# Patient Record
Sex: Female | Born: 1975 | Race: Black or African American | Hispanic: No | Marital: Single | Smoking: Current every day smoker
Health system: Southern US, Community
[De-identification: ages and names within clinical notes are randomized; demographics above are authoritative.]

---

## 2005-06-06 ENCOUNTER — Observation Stay: Payer: Self-pay | Admitting: Obstetrics and Gynecology

## 2005-08-29 ENCOUNTER — Observation Stay: Payer: Self-pay | Admitting: Obstetrics and Gynecology

## 2005-09-04 ENCOUNTER — Inpatient Hospital Stay: Payer: Self-pay | Admitting: Obstetrics and Gynecology

## 2007-01-05 ENCOUNTER — Emergency Department (HOSPITAL_COMMUNITY): Admission: EM | Admit: 2007-01-05 | Discharge: 2007-01-05 | Payer: Self-pay | Admitting: Emergency Medicine

## 2008-06-24 ENCOUNTER — Emergency Department: Payer: Self-pay | Admitting: Emergency Medicine

## 2008-08-17 ENCOUNTER — Emergency Department: Payer: Self-pay | Admitting: Emergency Medicine

## 2009-12-12 ENCOUNTER — Emergency Department: Payer: Self-pay | Admitting: Emergency Medicine

## 2013-02-11 ENCOUNTER — Emergency Department: Payer: Self-pay | Admitting: Emergency Medicine

## 2014-05-06 ENCOUNTER — Emergency Department: Payer: Self-pay | Admitting: Emergency Medicine

## 2014-05-21 ENCOUNTER — Emergency Department: Payer: Self-pay | Admitting: Emergency Medicine

## 2015-03-04 ENCOUNTER — Encounter: Payer: Self-pay | Admitting: *Deleted

## 2015-03-04 ENCOUNTER — Emergency Department
Admission: EM | Admit: 2015-03-04 | Discharge: 2015-03-04 | Disposition: A | Discharge status: Home / Self Care | Payer: Self-pay | Attending: Emergency Medicine | Admitting: Emergency Medicine

## 2015-03-04 DIAGNOSIS — K047 Periapical abscess without sinus: Secondary | ICD-10-CM

## 2015-03-04 DIAGNOSIS — R220 Localized swelling, mass and lump, head: Secondary | ICD-10-CM

## 2015-03-04 DIAGNOSIS — R112 Nausea with vomiting, unspecified: Secondary | ICD-10-CM | POA: Insufficient documentation

## 2015-03-04 DIAGNOSIS — Z720 Tobacco use: Secondary | ICD-10-CM | POA: Insufficient documentation

## 2015-03-04 DIAGNOSIS — R51 Headache: Secondary | ICD-10-CM | POA: Insufficient documentation

## 2015-03-04 DIAGNOSIS — K029 Dental caries, unspecified: Secondary | ICD-10-CM

## 2015-03-04 DIAGNOSIS — R42 Dizziness and giddiness: Secondary | ICD-10-CM | POA: Insufficient documentation

## 2015-03-04 LAB — CBC WITH DIFFERENTIAL/PLATELET
BASOS ABS: 0.1 10*3/uL (ref 0–0.1)
Basophils Relative: 1 %
Eosinophils Absolute: 0.2 10*3/uL (ref 0–0.7)
Eosinophils Relative: 2 %
HEMATOCRIT: 42.9 % (ref 35.0–47.0)
Hemoglobin: 14.5 g/dL (ref 12.0–16.0)
LYMPHS ABS: 4 10*3/uL — AB (ref 1.0–3.6)
LYMPHS PCT: 29 %
MCH: 31.9 pg (ref 26.0–34.0)
MCHC: 33.8 g/dL (ref 32.0–36.0)
MCV: 94.5 fL (ref 80.0–100.0)
MONO ABS: 1.1 10*3/uL — AB (ref 0.2–0.9)
Monocytes Relative: 8 %
NEUTROS ABS: 8.2 10*3/uL — AB (ref 1.4–6.5)
Neutrophils Relative %: 60 %
Platelets: 214 10*3/uL (ref 150–440)
RBC: 4.54 MIL/uL (ref 3.80–5.20)
RDW: 14 % (ref 11.5–14.5)
WBC: 13.5 10*3/uL — ABNORMAL HIGH (ref 3.6–11.0)

## 2015-03-04 MED ORDER — TRAMADOL HCL 50 MG PO TABS: 50.0000 mg | ORAL_TABLET | Freq: Four times a day (QID) | ORAL | Status: DC | PRN

## 2015-03-04 MED ORDER — CLINDAMYCIN PHOSPHATE 600 MG/50ML IV SOLN
600.0000 mg | Freq: Once | INTRAVENOUS | Status: AC
  Administered 2015-03-04: 600 mg via INTRAVENOUS
  Filled 2015-03-04: qty 50

## 2015-03-04 MED ORDER — CLINDAMYCIN HCL 300 MG PO CAPS: 300.0000 mg | ORAL_CAPSULE | Freq: Three times a day (TID) | ORAL | Status: DC

## 2015-03-04 MED ORDER — TRAMADOL HCL 50 MG PO TABS
50.0000 mg | ORAL_TABLET | Freq: Once | ORAL | Status: AC
  Administered 2015-03-04: 50 mg via ORAL
  Filled 2015-03-04: qty 1

## 2015-03-04 NOTE — ED Provider Notes (Signed)
[facility] Emergency Department Provider Note  ____________________________________________  Time seen: Approximately 194 AM  I have reviewed the triage vital signs and the nursing notes.   HISTORY  Chief Complaint Dental Pain    HPI Chelsey Edwards is a 39 y.o. female who comes in with a dental abscess. The patient reports that she had a bad tooth on the side. The patient has been taking Motrin, Tylenol and Aleve but the pain has not been getting any better. The patient reports her pain as a 10 out of 10 in intensity. She reports that she felt hot but did not take her temperature. The patient has had a headache and some lightheadedness. She reports that this abscess and swelling of her facial up yesterday. The patient has some nausea with vomiting on Friday. She denies abdominal pain or shortness of breath. She reports that it does hurt to open her mouth but she is able to open her mouth. The patient denies any other problems at this time. She reports she still waiting for her dental insurance before she sees the dentist.   History reviewed. No pertinent past medical history.  There are no active problems to display for this patient.   History reviewed. No pertinent past surgical history.  Current Outpatient Rx  Name  Route  Sig  Dispense  Refill  . clindamycin (CLEOCIN) 300 MG capsule   Oral   Take 1 capsule (300 mg total) by mouth 3 (three) times daily.   15 capsule   0   . traMADol (ULTRAM) 50 MG tablet   Oral   Take 1 tablet (50 mg total) by mouth every 6 (six) hours as needed.   12 tablet   0     Allergies Review of patient's allergies indicates no known allergies.  History reviewed. No pertinent family history.  Social History Social History  Substance Use Topics  . Smoking status: Current Every Day Smoker -- 0.50 packs/day    Types: Cigarettes  . Smokeless tobacco: None  . Alcohol Use: Yes     Comment: occasionally    Review  of Systems Constitutional: No fever/chills Eyes: No visual changes. ENT: Dental pain No sore throat. Cardiovascular: Denies chest pain. Respiratory: Denies shortness of breath. Gastrointestinal: Nausea and vomiting Genitourinary: Negative for dysuria. Musculoskeletal: Negative for back pain. Skin: Negative for rash. Neurological: Dizziness  10-point ROS otherwise negative.  ____________________________________________   PHYSICAL EXAM:  VITAL SIGNS: ED Triage Vitals  Enc Vitals Group     BP 03/04/15 0312 133/86 mmHg     Pulse Rate 03/04/15 0312 96     Resp 03/04/15 0312 14     Temp 03/04/15 0312 98.6 F (37 C)     Temp Source 03/04/15 0312 Oral     SpO2 03/04/15 0312 96 %     Weight 03/04/15 0312 197 lb (89.359 kg)     Height 03/04/15 0312 5' 4.5" (1.638 m)     Head Cir --      Peak Flow --      Pain Score 03/04/15 0313 10     Pain Loc --      Pain Edu? --      Excl. in [place]? --     Constitutional: Alert and oriented. Well appearing and in moderate distress. Eyes: Conjunctivae are normal. PERRL. EOMI. Head: Atraumatic. Nose: No congestion/rhinnorhea. Mouth/Throat: Mucous membranes are moist.  Oropharynx non-erythematous. Poor dentition, left 1st molar with decay and tender to palpation, soft tissue swelling  to left jaw with no definitive abscess Cardiovascular: Normal rate, regular rhythm. Grossly normal heart sounds.  Good peripheral circulation. Respiratory: Normal respiratory effort.  No retractions. Lungs CTAB. Gastrointestinal: Soft and nontender. No distention. Positive bowel sounds Musculoskeletal: No lower extremity tenderness nor edema.   Neurologic:  Normal speech and language. Skin:  Skin is warm, dry and intact.  Psychiatric: Mood and affect are normal.   ____________________________________________   LABS (all labs ordered are listed, but only abnormal results are displayed)  Labs Reviewed  CBC WITH DIFFERENTIAL/PLATELET - Abnormal; Notable for the  following:    WBC 13.5 (*)    Neutro Abs 8.2 (*)    Lymphs Abs 4.0 (*)    Monocytes Absolute 1.1 (*)    All other components within normal limits   ____________________________________________  EKG  none ____________________________________________  RADIOLOGY  none ____________________________________________   PROCEDURES  Procedure(s) performed: None  Critical Care performed: No  ____________________________________________   INITIAL IMPRESSION / ASSESSMENT AND PLAN / ED COURSE  Pertinent labs & imaging results that were available during my care of the patient were reviewed by me and considered in my medical decision making (see chart for details).  This is a 39 year old female who comes in with left-sided facial pain and swelling consistent with a dental abscess. The patient did have a white blood cell count done which was 13. The patient is not vomiting but reports that she feels unwell. I will give the patient a dose of clindamycin IV as well as some tramadol orally for her pain. The patient is driving home so I cannot give her a stronger narcotic. I will discharge the patient and I did encourage her to follow-up with the [place] [facility] in the next 1-2 days for extraction of her tooth. ____________________________________________   FINAL CLINICAL IMPRESSION(S) / ED DIAGNOSES  Final diagnoses:  Dental abscess  Facial swelling  Dental caries      Loney Hering, MD 03/04/15 [phone]

## 2015-03-04 NOTE — Discharge Instructions (Signed)
Dental Abscess A dental abscess is a collection of infected fluid (pus) from a bacterial infection in the inner part of the tooth (pulp). It usually occurs at the end of the tooth's root.  CAUSES   Severe tooth decay.  Trauma to the tooth that allows bacteria to enter into the pulp, such as a broken or chipped tooth. SYMPTOMS   Severe pain in and around the infected tooth.  Swelling and redness around the abscessed tooth or in the mouth or face.  Tenderness.  Pus drainage.  Bad breath.  Bitter taste in the mouth.  Difficulty swallowing.  Difficulty opening the mouth.  Nausea.  Vomiting.  Chills.  Swollen neck glands. DIAGNOSIS   A medical and dental history will be taken.  An examination will be performed by tapping on the abscessed tooth.  X-rays may be taken of the tooth to identify the abscess. TREATMENT The goal of treatment is to eliminate the infection. You may be prescribed antibiotic medicine to stop the infection from spreading. A root canal may be performed to save the tooth. If the tooth cannot be saved, it may be pulled (extracted) and the abscess may be drained.  HOME CARE INSTRUCTIONS  Only take over-the-counter or prescription medicines for pain, fever, or discomfort as directed by your caregiver.  Rinse your mouth (gargle) often with salt water ( tsp salt in 8 oz [250 ml] of warm water) to relieve pain or swelling.  Do not drive after taking pain medicine (narcotics).  Do not apply heat to the outside of your face.  Return to your dentist for further treatment as directed. SEEK MEDICAL CARE IF:  Your pain is not helped by medicine.  Your pain is getting worse instead of better. SEEK IMMEDIATE MEDICAL CARE IF:  You have a fever or persistent symptoms for more than 2-3 days.  You have a fever and your symptoms suddenly get worse.  You have chills or a very bad headache.  You have problems breathing or swallowing.  You have trouble  opening your mouth.  You have swelling in the neck or around the eye. Document Released: 05/26/2005 Document Revised: 02/18/2012 Document Reviewed: 09/03/2010 [facility] Patient Information 2015 [place], [place]. This information is not intended to replace advice given to you by your health care provider. Make sure you discuss any questions you have with your health care provider.  Dental Pain A tooth ache may be caused by cavities (tooth decay). Cavities expose the nerve of the tooth to air and hot or cold temperatures. It may come from an infection or abscess (also called a boil or furuncle) around your tooth. It is also often caused by dental caries (tooth decay). This causes the pain you are having. DIAGNOSIS  Your caregiver can diagnose this problem by exam. TREATMENT   If caused by an infection, it may be treated with medications which kill germs (antibiotics) and pain medications as prescribed by your caregiver. Take medications as directed.  Only take over-the-counter or prescription medicines for pain, discomfort, or fever as directed by your caregiver.  Whether the tooth ache today is caused by infection or dental disease, you should see your dentist as soon as possible for further care. SEEK MEDICAL CARE IF: The exam and treatment you received today has been provided on an emergency basis only. This is not a substitute for complete medical or dental care. If your problem worsens or new problems (symptoms) appear, and you are unable to meet with your dentist, call or  return to this location. SEEK IMMEDIATE MEDICAL CARE IF:   You have a fever.  You develop redness and swelling of your face, jaw, or neck.  You are unable to open your mouth.  You have severe pain uncontrolled by pain medicine. MAKE SURE YOU:   Understand these instructions.  Will watch your condition.  Will get help right away if you are not doing well or get worse. Document Released: 05/26/2005 Document  Revised: 08/18/2011 Document Reviewed: 01/12/2008 [facility] Patient Information 2015 [place], [place]. This information is not intended to replace advice given to you by your health care provider. Make sure you discuss any questions you have with your health care provider.  Dental Caries Dental caries (also called tooth decay) is the most common oral disease. It can occur at any age but is more common in children and young adults.  HOW DENTAL CARIES DEVELOPS  The process of decay begins when bacteria and foods (particularly sugars and starches) combine in your mouth to produce plaque. Plaque is a substance that sticks to the hard, outer surface of a tooth (enamel). The bacteria in plaque produce acids that attack enamel. These acids may also attack the root surface of a tooth (cementum) if it is exposed. Repeated attacks dissolve these surfaces and create holes in the tooth (cavities). If left untreated, the acids destroy the other layers of the tooth.  RISK FACTORS  Frequent sipping of sugary beverages.   Frequent snacking on sugary and starchy foods, especially those that easily get stuck in the teeth.   Poor oral hygiene.   Dry mouth.   Substance abuse such as methamphetamine abuse.   Broken or poor-fitting dental restorations.   Eating disorders.   Gastroesophageal reflux disease (GERD).   Certain radiation treatments to the head and neck. SYMPTOMS In the early stages of dental caries, symptoms are seldom present. Sometimes white, chalky areas may be seen on the enamel or other tooth layers. In later stages, symptoms may include:  Pits and holes on the enamel.  Toothache after sweet, hot, or cold foods or drinks are consumed.  Pain around the tooth.  Swelling around the tooth. DIAGNOSIS  Most of the time, dental caries is detected during a regular dental checkup. A diagnosis is made after a thorough medical and dental history is taken and the surfaces of your teeth are  checked for signs of dental caries. Sometimes special instruments, such as lasers, are used to check for dental caries. Dental X-ray exams may be taken so that areas not visible to the eye (such as between the contact areas of the teeth) can be checked for cavities.  TREATMENT  If dental caries is in its early stages, it may be reversed with a fluoride treatment or an application of a remineralizing agent at the dental office. Thorough brushing and flossing at home is needed to aid these treatments. If it is in its later stages, treatment depends on the location and extent of tooth destruction:   If a small area of the tooth has been destroyed, the destroyed area will be removed and cavities will be filled with a material such as gold, silver amalgam, or composite resin.   If a large area of the tooth has been destroyed, the destroyed area will be removed and a cap (crown) will be fitted over the remaining tooth structure.   If the center part of the tooth (pulp) is affected, a procedure called a root canal will be needed before a filling or  crown can be placed.   If most of the tooth has been destroyed, the tooth may need to be pulled (extracted). HOME CARE INSTRUCTIONS You can prevent, stop, or reverse dental caries at home by practicing good oral hygiene. Good oral hygiene includes:  Thoroughly cleaning your teeth at least twice a day with a toothbrush and dental floss.   Using a fluoride toothpaste. A fluoride mouth rinse may also be used if recommended by your dentist or health care provider.   Restricting the amount of sugary and starchy foods and sugary liquids you consume.   Avoiding frequent snacking on these foods and sipping of these liquids.   Keeping regular visits with a dentist for checkups and cleanings. PREVENTION   Practice good oral hygiene.  Consider a dental sealant. A dental sealant is a coating material that is applied by your dentist to the pits and grooves  of teeth. The sealant prevents food from being trapped in them. It may protect the teeth for several years.  Ask about fluoride supplements if you live in a community without fluorinated water or with water that has a low fluoride content. Use fluoride supplements as directed by your dentist or health care provider.  Allow fluoride varnish applications to teeth if directed by your dentist or health care provider. Document Released: 02/15/2002 Document Revised: 10/10/2013 Document Reviewed: 05/28/2012 [facility] Patient Information 2015 [place], [place]. This information is not intended to replace advice given to you by your health care provider. Make sure you discuss any questions you have with your health care provider.

## 2015-03-04 NOTE — ED Notes (Deleted)
Pt presents in [employer] custody for forensic blood draw. Pt minor, not accompanied by parent. Pt was ambulatory and in no acute distress at this time.

## 2015-03-04 NOTE — ED Notes (Signed)
Pt reports pain to left lower jaw x 2 days, swelling starting yesterday, triage nurse states swelling has worsened while pt waiting.  Missing molar noted, pt reports extracted years ago.  Tooth next to gap appears may have possible cavity.  Pt NAD at this time.

## 2015-03-04 NOTE — ED Notes (Signed)
Pt states lower left dental pain began 03/02/15 around lunchtime. Swelling began 03/03/15 and has continued to worsen. Pt denies any precipitating events.

## 2019-12-13 ENCOUNTER — Emergency Department: Payer: Self-pay

## 2019-12-13 ENCOUNTER — Emergency Department
Admission: EM | Admit: 2019-12-13 | Discharge: 2019-12-13 | Disposition: A | Discharge status: Home / Self Care | Payer: Self-pay | Attending: Emergency Medicine | Admitting: Emergency Medicine

## 2019-12-13 ENCOUNTER — Other Ambulatory Visit: Payer: Self-pay

## 2019-12-13 ENCOUNTER — Encounter: Payer: Self-pay | Admitting: *Deleted

## 2019-12-13 DIAGNOSIS — W010XXA Fall on same level from slipping, tripping and stumbling without subsequent striking against object, initial encounter: Secondary | ICD-10-CM | POA: Insufficient documentation

## 2019-12-13 DIAGNOSIS — Y92091 Bathroom in other non-institutional residence as the place of occurrence of the external cause: Secondary | ICD-10-CM | POA: Insufficient documentation

## 2019-12-13 DIAGNOSIS — R52 Pain, unspecified: Secondary | ICD-10-CM

## 2019-12-13 DIAGNOSIS — S93402A Sprain of unspecified ligament of left ankle, initial encounter: Secondary | ICD-10-CM | POA: Insufficient documentation

## 2019-12-13 DIAGNOSIS — Y999 Unspecified external cause status: Secondary | ICD-10-CM | POA: Insufficient documentation

## 2019-12-13 DIAGNOSIS — Y93E1 Activity, personal bathing and showering: Secondary | ICD-10-CM | POA: Insufficient documentation

## 2019-12-13 DIAGNOSIS — M25572 Pain in left ankle and joints of left foot: Secondary | ICD-10-CM | POA: Insufficient documentation

## 2019-12-13 NOTE — ED Triage Notes (Signed)
Pt stating she was cleaning the shower, slipped in the water, now having left ankle pain and swelling.

## 2019-12-13 NOTE — ED Provider Notes (Signed)
  ER Provider Note       Time seen: 9:20 PM    I have reviewed the vital signs and the nursing notes.  HISTORY   Chief Complaint Ankle Pain    HPI Chelsey Edwards is a 44 y.o. female with no known past medical history who presents today for left ankle pain and swelling.  Patient states she was cleaning the shower and she slipped in the water.  Discomfort is 8 out of 10 in the left ankle.  History reviewed. No pertinent past medical history.  History reviewed. No pertinent surgical history.  Allergies Patient has no known allergies.  Review of Systems Constitutional: Negative for fever. Musculoskeletal: Positive for left ankle pain Skin: Negative for rash. Neurological: Negative for headaches, focal weakness or numbness.  All systems negative/normal/unremarkable except as stated in the HPI  ____________________________________________   PHYSICAL EXAM:  VITAL SIGNS: Vitals:   12/13/19 2004  BP: (!) 151/93  Pulse: 96  Resp: 16  Temp: 99.2 F (37.3 C)  SpO2: 97%    Constitutional: Alert and oriented. Well appearing and in no distress. Musculoskeletal: Mild tenderness and swelling over the left ankle, pain with inversion of the left ankle Neurologic:  Normal speech and language. No gross focal neurologic deficits are appreciated.  Skin:  Skin is warm, dry, there are 2 abrasions or superficial lacerations over the lateral malleolus and 1 over the lateral aspect of the fifth digit of uncertain etiology Psychiatric: Speech and behavior are normal.  ____________________________________________   RADIOLOGY  Images were viewed by me X-rays of the ankle and foot are unremarkable  DIFFERENTIAL DIAGNOSIS  Contusion, sprain, fracture, dislocation  ASSESSMENT AND PLAN  Left ankle sprain   Plan: The patient had presented for left ankle pain after slipping and falling.  She was offered a tetanus shot but states she will get this as an outpatient.  We will place  her in an Ace wrap and advised light activity and NSAIDs.  She is cleared for outpatient follow-up.  Lenise Arena MD    Note: This note was generated in part or whole with voice recognition software. Voice recognition is usually quite accurate but there are transcription errors that can and very often do occur. I apologize for any typographical errors that were not detected and corrected.     Earleen Newport, MD 12/13/19 2125

## 2020-07-16 ENCOUNTER — Emergency Department: Payer: 59

## 2020-07-16 ENCOUNTER — Other Ambulatory Visit: Payer: Self-pay

## 2020-07-16 ENCOUNTER — Emergency Department
Admission: EM | Admit: 2020-07-16 | Discharge: 2020-07-16 | Disposition: A | Discharge status: Home / Self Care | Payer: 59 | Attending: Student in an Organized Health Care Education/Training Program | Admitting: Student in an Organized Health Care Education/Training Program

## 2020-07-16 DIAGNOSIS — F17210 Nicotine dependence, cigarettes, uncomplicated: Secondary | ICD-10-CM | POA: Diagnosis not present

## 2020-07-16 DIAGNOSIS — R1031 Right lower quadrant pain: Secondary | ICD-10-CM | POA: Insufficient documentation

## 2020-07-16 LAB — CBC
HCT: 38.2 % (ref 36.0–46.0)
Hemoglobin: 12.6 g/dL (ref 12.0–15.0)
MCH: 30.5 pg (ref 26.0–34.0)
MCHC: 33 g/dL (ref 30.0–36.0)
MCV: 92.5 fL (ref 80.0–100.0)
Platelets: 295 10*3/uL (ref 150–400)
RBC: 4.13 MIL/uL (ref 3.87–5.11)
RDW: 14.9 % (ref 11.5–15.5)
WBC: 10.6 10*3/uL — ABNORMAL HIGH (ref 4.0–10.5)
nRBC: 0 % (ref 0.0–0.2)

## 2020-07-16 LAB — URINALYSIS, COMPLETE (UACMP) WITH MICROSCOPIC
Bacteria, UA: NONE SEEN
Bilirubin Urine: NEGATIVE
Glucose, UA: NEGATIVE mg/dL
Ketones, ur: NEGATIVE mg/dL
Leukocytes,Ua: NEGATIVE
Nitrite: NEGATIVE
Protein, ur: NEGATIVE mg/dL
Specific Gravity, Urine: 1.016 (ref 1.005–1.030)
pH: 5 (ref 5.0–8.0)

## 2020-07-16 LAB — PREGNANCY, URINE: Preg Test, Ur: NEGATIVE

## 2020-07-16 LAB — COMPREHENSIVE METABOLIC PANEL
ALT: 13 U/L (ref 0–44)
AST: 14 U/L — ABNORMAL LOW (ref 15–41)
Albumin: 4.1 g/dL (ref 3.5–5.0)
Alkaline Phosphatase: 49 U/L (ref 38–126)
Anion gap: 9 (ref 5–15)
BUN: 9 mg/dL (ref 6–20)
CO2: 23 mmol/L (ref 22–32)
Calcium: 9 mg/dL (ref 8.9–10.3)
Chloride: 105 mmol/L (ref 98–111)
Creatinine, Ser: 0.58 mg/dL (ref 0.44–1.00)
GFR, Estimated: >60 mL/min (ref >60)
Glucose, Bld: 87 mg/dL (ref 70–99)
Potassium: 4.1 mmol/L (ref 3.5–5.1)
Sodium: 137 mmol/L (ref 135–145)
Total Bilirubin: 0.3 mg/dL (ref 0.3–1.2)
Total Protein: 7.4 g/dL (ref 6.5–8.1)

## 2020-07-16 LAB — LIPASE, BLOOD: Lipase: 31 U/L (ref 11–51)

## 2020-07-16 MED ORDER — ONDANSETRON HCL 4 MG/2ML IJ SOLN
4.0000 mg | Freq: Once | INTRAMUSCULAR | Status: AC
  Administered 2020-07-16: 4 mg via INTRAVENOUS
  Filled 2020-07-16: qty 2

## 2020-07-16 MED ORDER — IOHEXOL 300 MG/ML  SOLN
100.0000 mL | Freq: Once | INTRAMUSCULAR | Status: AC | PRN
  Administered 2020-07-16: 100 mL via INTRAVENOUS
  Filled 2020-07-16: qty 100

## 2020-07-16 MED ORDER — KETOROLAC TROMETHAMINE 30 MG/ML IJ SOLN
15.0000 mg | Freq: Once | INTRAMUSCULAR | Status: AC
  Administered 2020-07-16: 15 mg via INTRAVENOUS
  Filled 2020-07-16: qty 1

## 2020-07-16 MED ORDER — MORPHINE SULFATE (PF) 4 MG/ML IV SOLN
4.0000 mg | INTRAVENOUS | Status: DC | PRN
  Administered 2020-07-16: 4 mg via INTRAVENOUS
  Filled 2020-07-16: qty 1

## 2020-07-16 NOTE — ED Provider Notes (Signed)
[facility] Emergency Department Provider Note    Event Date/Time   First MD Initiated Contact with Patient 07/16/20 1340     (approximate)  I have reviewed the triage vital signs and the nursing notes.   HISTORY  Chief Complaint Abdominal Pain    HPI Chelsey Edwards is a 45 y.o. female no significant past medical history presents to the ER for several days of progressively worsening right side and right lower quadrant abdominal pain.  Is hurting with movement.  Has had some chills but no measured temperature.  Is never had pain like this before.  Describes it as a gnawing aching sensation.  No dysuria.  No vaginal discharge.  No vomiting.    History reviewed. No pertinent past medical history. No family history on file. History reviewed. No pertinent surgical history. There are no problems to display for this patient.     Prior to Admission medications   Medication Sig Start Date End Date Taking? Authorizing Provider  clindamycin (CLEOCIN) 300 MG capsule Take 1 capsule (300 mg total) by mouth 3 (three) times daily. 03/04/15   Loney Hering, MD  traMADol (ULTRAM) 50 MG tablet Take 1 tablet (50 mg total) by mouth every 6 (six) hours as needed. 03/04/15   Loney Hering, MD    Allergies Patient has no known allergies.    Social History Social History   Tobacco Use  . Smoking status: Current Every Day Smoker    Packs/day: 0.50    Types: Cigarettes  . Smokeless tobacco: Never Used  Substance Use Topics  . Alcohol use: Yes    Comment: occasionally  . Drug use: No    Review of Systems Patient denies headaches, rhinorrhea, blurry vision, numbness, shortness of breath, chest pain, edema, cough, abdominal pain, nausea, vomiting, diarrhea, dysuria, fevers, rashes or hallucinations unless otherwise stated above in HPI. ____________________________________________   PHYSICAL EXAM:  VITAL SIGNS: Vitals:   07/16/20 1205  BP: 123/79   Pulse: 86  Resp: 17  Temp: 98.2 F (36.8 C)  SpO2: 100%    Constitutional: Alert and oriented.  Eyes: Conjunctivae are normal.  Head: Atraumatic. Nose: No congestion/rhinnorhea. Mouth/Throat: Mucous membranes are moist.   Neck: No stridor. Painless ROM.  Cardiovascular: Normal rate, regular rhythm. Grossly normal heart sounds.  Good peripheral circulation. Respiratory: Normal respiratory effort.  No retractions. Lungs CTAB. Gastrointestinal: Soft with no guarding or rebound but tenderness to palpation of the right lower quadrant.  No appreciable hernia or mass.. No distention. No abdominal bruits. No CVA tenderness. Genitourinary:  Musculoskeletal: No lower extremity tenderness nor edema.  No joint effusions. Neurologic:  Normal speech and language. No gross focal neurologic deficits are appreciated. No facial droop Skin:  Skin is warm, dry and intact. No rash noted. Psychiatric: Mood and affect are normal. Speech and behavior are normal.  ____________________________________________   LABS (all labs ordered are listed, but only abnormal results are displayed)  Results for orders placed or performed during the hospital encounter of 07/16/20 (from the past 24 hour(s))  Lipase, blood     Status: None   Collection Time: 07/16/20 12:12 PM  Result Value Ref Range   Lipase 31 11 - 51 U/L  Comprehensive metabolic panel     Status: Abnormal   Collection Time: 07/16/20 12:12 PM  Result Value Ref Range   Sodium 137 135 - 145 mmol/L   Potassium 4.1 3.5 - 5.1 mmol/L   Chloride 105 98 - 111 mmol/L  CO2 23 22 - 32 mmol/L   Glucose, Bld 87 70 - 99 mg/dL   BUN 9 6 - 20 mg/dL   Creatinine, Ser 0.58 0.44 - 1.00 mg/dL   Calcium 9.0 8.9 - 10.3 mg/dL   Total Protein 7.4 6.5 - 8.1 g/dL   Albumin 4.1 3.5 - 5.0 g/dL   AST 14 (L) 15 - 41 U/L   ALT 13 0 - 44 U/L   Alkaline Phosphatase 49 38 - 126 U/L   Total Bilirubin 0.3 0.3 - 1.2 mg/dL   GFR, Estimated >60 >60 mL/min   Anion gap 9 5 - 15   CBC     Status: Abnormal   Collection Time: 07/16/20 12:12 PM  Result Value Ref Range   WBC 10.6 (H) 4.0 - 10.5 K/uL   RBC 4.13 3.87 - 5.11 MIL/uL   Hemoglobin 12.6 12.0 - 15.0 g/dL   HCT 38.2 36.0 - 46.0 %   MCV 92.5 80.0 - 100.0 fL   MCH 30.5 26.0 - 34.0 pg   MCHC 33.0 30.0 - 36.0 g/dL   RDW 14.9 11.5 - 15.5 %   Platelets 295 150 - 400 K/uL   nRBC 0.0 0.0 - 0.2 %  Urinalysis, Complete w Microscopic     Status: Abnormal   Collection Time: 07/16/20 12:12 PM  Result Value Ref Range   Color, Urine YELLOW (A) YELLOW   APPearance CLEAR (A) CLEAR   Specific Gravity, Urine 1.016 1.005 - 1.030   pH 5.0 5.0 - 8.0   Glucose, UA NEGATIVE NEGATIVE mg/dL   Hgb urine dipstick SMALL (A) NEGATIVE   Bilirubin Urine NEGATIVE NEGATIVE   Ketones, ur NEGATIVE NEGATIVE mg/dL   Protein, ur NEGATIVE NEGATIVE mg/dL   Nitrite NEGATIVE NEGATIVE   Leukocytes,Ua NEGATIVE NEGATIVE   RBC / HPF 0-5 0 - 5 RBC/hpf   WBC, UA 0-5 0 - 5 WBC/hpf   Bacteria, UA NONE SEEN NONE SEEN   Squamous Epithelial / LPF 0-5 0 - 5   Mucus PRESENT   Pregnancy, urine     Status: None   Collection Time: 07/16/20 12:12 PM  Result Value Ref Range   Preg Test, Ur NEGATIVE NEGATIVE   ____________________________________________ ____________________________________________  RADIOLOGY  I personally reviewed all radiographic images ordered to evaluate for the above acute complaints and reviewed radiology reports and findings.  These findings were personally discussed with the patient.  Please see medical record for radiology report.  ____________________________________________   PROCEDURES  Procedure(s) performed:  Procedures    Critical Care performed: no ____________________________________________   INITIAL IMPRESSION / ASSESSMENT AND PLAN / ED COURSE  Pertinent labs & imaging results that were available during my care of the patient were reviewed by me and considered in my medical decision making (see chart  for details).   DDX: appendicitis, colitis, diverticulitis, msk strain, enteritis, uti, pyelo, stone  Chelsey Edwards is a 45 y.o. who presents to the ED with presentation as described above. She is afebrile hemodynamically stable does have borderline elevated white count does have some mild right-sided abdominal pain. No guarding or rebound therefore start with CT imaging. Denies any vaginal discharge to suggest pelvic etiology.  Clinical Course as of 07/16/20 [place] Jul 16, 2020  1651 Patient reassessed.  Appears improved.  Imaging is grossly reassuring.  Not consistent with acute appendicitis.  No sign of torsion.  May be having symptoms secondary to uterine fibroid?Marland Kitchen  No sign of UTI or stone.  Given reassuring exam  and work-up thus far feel that she is appropriate for trial of outpatient management and follow-up with OB/GYN.  Patient agreeable to plan.  Demonstrates understanding of signs and symptoms for which she should return to the ER. [PR]    Clinical Course User Index [PR] Merlyn Lot, MD    The patient was evaluated in Emergency Department today for the symptoms described in the history of present illness. He/she was evaluated in the context of the global COVID-19 pandemic, which necessitated consideration that the patient might be at risk for infection with the SARS-CoV-2 virus that causes COVID-19. Institutional protocols and algorithms that pertain to the evaluation of patients at risk for COVID-19 are in a state of rapid change based on information released by regulatory bodies including the CDC and federal and state organizations. These policies and algorithms were followed during the patient's care in the ED.  As part of my medical decision making, I reviewed the following data within the [place] notes reviewed and incorporated, Labs reviewed, notes from prior ED visits and [place] Controlled Substance  Database   ____________________________________________   FINAL CLINICAL IMPRESSION(S) / ED DIAGNOSES  Final diagnoses:  RLQ abdominal pain      NEW MEDICATIONS STARTED DURING THIS VISIT:  New Prescriptions   No medications on file     Note:  This document was prepared using Dragon voice recognition software and may include unintentional dictation errors.    Merlyn Lot, MD 07/16/20 [phone]

## 2020-07-16 NOTE — Discharge Instructions (Signed)

## 2020-07-16 NOTE — ED Triage Notes (Signed)
Pt c/o RLQ pain that radiates into the left for the past week, denies N/V/D.Marland Kitchen

## 2020-09-11 LAB — HM PAP SMEAR: HM Pap smear: NEGATIVE

## 2022-02-20 ENCOUNTER — Ambulatory Visit: Payer: Self-pay

## 2022-02-20 NOTE — Telephone Encounter (Signed)
Summary: knot on upper back   Pt states she has a knot on her lt upper back near bra line x1y   Pt states the knot feels tight and is growing in size   Please assist further      Called pt - [id number]

## 2022-02-20 NOTE — Telephone Encounter (Signed)
  Chief Complaint: Mass Symptoms: Lump on upper back x 1 year ."Seems to be growing. No pain, "Little itchy." Frequency: 1 year Pertinent Negatives: Patient denies pain, fever rash Disposition: '[]'$ ED /'[]'$ Urgent Care (no appt availability in office) / '[]'$ Appointment(In office/virtual)/ '[]'$  [facility]/ '[]'$ Home Care/ '[]'$ Refused Recommended Disposition /'[x]'$ [facility]/ '[]'$  Follow-up with PCP Additional Notes: Mobile clinic advised. New pt appt secured for Jan 2024 for Dr. Army Melia. Answer Assessment - Initial Assessment Questions 1. APPEARANCE of SWELLING: "What does it look like?"     Lump 2. SIZE: "How large is the swelling?" (e.g., inches, cm; or compare to size of pinhead, tip of pen, eraser, coin, pea, grape, ping pong ball)      Bigger than .50 piece 3. LOCATION: "Where is the swelling located?"     Upper back near bra line. 4. ONSET: "When did the swelling start?"      1 year ago 5. COLOR: "What color is it?" "Is there more than one color?"     No color 6. PAIN: "Is there any pain?" If Yes, ask: "How bad is the pain?" (e.g., scale 1-10; or mild, moderate, severe)     - NONE (0): no pain   - MILD (1-3): doesn't interfere with normal activities    - MODERATE (4-7): interferes with normal activities or awakens from sleep    - SEVERE (8-10): excruciating pain, unable to do any normal activities     "Feels tight at times." 7. ITCH: "Does it itch?" If Yes, ask: "How bad is the itch?"      Yes, mild 8. CAUSE: "What do you think caused the swelling?" 9 OTHER SYMPTOMS: "Do you have any other symptoms?" (e.g., fever)     Fatigued at times  Protocols used: Skin Lump or Localized Swelling-A-AH

## 2022-03-04 ENCOUNTER — Ambulatory Visit: Payer: Self-pay | Admitting: Internal Medicine

## 2022-06-16 ENCOUNTER — Encounter: Payer: Self-pay | Admitting: General Practice

## 2022-06-23 ENCOUNTER — Encounter: Payer: Self-pay | Admitting: Internal Medicine

## 2022-06-23 ENCOUNTER — Other Ambulatory Visit: Payer: Self-pay | Admitting: Internal Medicine

## 2022-06-23 ENCOUNTER — Ambulatory Visit: Payer: 59 | Admitting: Internal Medicine

## 2022-06-23 VITALS — BP 126/78 | HR 97 | Ht 64.0 in | Wt 165.0 lb

## 2022-06-23 DIAGNOSIS — D250 Submucous leiomyoma of uterus: Secondary | ICD-10-CM | POA: Diagnosis not present

## 2022-06-23 DIAGNOSIS — D251 Intramural leiomyoma of uterus: Secondary | ICD-10-CM | POA: Diagnosis not present

## 2022-06-23 DIAGNOSIS — N920 Excessive and frequent menstruation with regular cycle: Secondary | ICD-10-CM

## 2022-06-23 DIAGNOSIS — D171 Benign lipomatous neoplasm of skin and subcutaneous tissue of trunk: Secondary | ICD-10-CM | POA: Diagnosis not present

## 2022-06-23 NOTE — Progress Notes (Signed)
Date:  06/23/2022   Name:  Chelsey Edwards   DOB:  December 01, 1975   MRN:  [id number]   Chief Complaint: New Patient (Initial Visit), Mass (Noticed it about 6 months ago. Unsure if growing in size.), Abdominal Pain (Started around 6 months or more ago. Has a lot of extra gas. Still has gall bladder and appendix. Stomach hurts before and after she eats. No diarrhea but sometimes has constipation.), and Anemia (Fatigue. Wants iron levels checked. Had anemia when she was pregnant with her son in the past.)  Abdominal Pain This is a recurrent problem. The current episode started more than 1 month ago. The problem occurs every several days. The pain is located in the epigastric region. The quality of the pain is cramping and a sensation of fullness. Associated symptoms include constipation. Pertinent negatives include no fever.  Anemia Symptoms include abdominal pain. There has been no fever.  Lump on left mid back- has been present for about 6 months.  No pain but feels pressure.  [place] 07/2020: IMPRESSION: 1. Enlarged myomatous uterus. Multiple intramural fibroids including a 3.6 x 4.0 x 4.2 cm right upper body, a 4.0 x 3.7 x 4.5 cm posterior midbody, and a 5.0 x 4.9 x 5.1 cm left upper body fibroid. Six 2. Unremarkable visualized endometrium and ovaries  CT ABD 07/2020: IMPRESSION: 1. 4.5 cm complex partially cystic lesion associated with the right ovary. I think this is most likely a hemorrhagic cyst. There is some fluid around it suggesting it could be partially ruptured or leaking. This could be responsible for the patient's right lower quadrant pain. Recommend pelvic ultrasound examination. 2. Enlarged fibroid uterus. 3. Normal appendix.  Lab Results  Component Value Date   NA 137 07/16/2020   K 4.1 07/16/2020   CO2 23 07/16/2020   GLUCOSE 87 07/16/2020   BUN 9 07/16/2020   CREATININE 0.58 07/16/2020   CALCIUM 9.0 07/16/2020   GFRNONAA >60 07/16/2020   No results found for:  "CHOL", "HDL", "LDLCALC", "LDLDIRECT", "TRIG", "CHOLHDL" No results found for: "TSH" No results found for: "HGBA1C" Lab Results  Component Value Date   WBC 10.6 (H) 07/16/2020   HGB 12.6 07/16/2020   HCT 38.2 07/16/2020   MCV 92.5 07/16/2020   PLT 295 07/16/2020   Lab Results  Component Value Date   ALT 13 07/16/2020   AST 14 (L) 07/16/2020   ALKPHOS 49 07/16/2020   BILITOT 0.3 07/16/2020   No results found for: "25OHVITD2", "25OHVITD3", "VD25OH"   Review of Systems  Constitutional:  Negative for chills, fatigue, fever and unexpected weight change.  Respiratory:  Negative for chest tightness, shortness of breath and wheezing.   Cardiovascular:  Negative for chest pain and leg swelling.  Gastrointestinal:  Positive for abdominal distention (lower abdominal fullness), abdominal pain and constipation.  Genitourinary:  Positive for menstrual problem (heavy bleeding).  Psychiatric/Behavioral:  Negative for dysphoric mood and sleep disturbance. The patient is not nervous/anxious.     Patient Active Problem List   Diagnosis Date Noted   Intramural and submucous leiomyoma of uterus 06/23/2022   Menorrhagia with regular cycle 06/23/2022   Lipoma of torso 06/23/2022    No Known Allergies  History reviewed. No pertinent surgical history.  Social History   Tobacco Use   Smoking status: Every Day    Packs/day: 0.50    Years: 20.00    Total pack years: 10.00    Types: Cigarettes    Start date: 2004   Smokeless tobacco:  Never  Vaping Use   Vaping Use: Never used  Substance Use Topics   Alcohol use: Yes    Comment: occasionally   Drug use: No     Medication list has been reviewed and updated.  No outpatient medications have been marked as taking for the 06/23/22 encounter (Office Visit) with Glean Hess, MD.       06/23/2022    2:53 PM  GAD 7 : Generalized Anxiety Score  Nervous, Anxious, on Edge 0  Control/stop worrying 0  Worry too much - different things 0   Trouble relaxing 0  Restless 0  Easily annoyed or irritable 1  Afraid - awful might happen 0  Total GAD 7 Score 1  Anxiety Difficulty Not difficult at all       06/23/2022    2:52 PM  Depression screen PHQ 2/9  Decreased Interest 1  Down, Depressed, Hopeless 1  PHQ - 2 Score 2  Altered sleeping 1  Tired, decreased energy 2  Change in appetite 0  Feeling bad or failure about yourself  0  Trouble concentrating 0  Moving slowly or fidgety/restless 0  Suicidal thoughts 0  PHQ-9 Score 5  Difficult doing work/chores Not difficult at all    BP Readings from Last 3 Encounters:  06/23/22 126/78  07/16/20 123/79  12/13/19 (!) 150/90    Physical Exam Vitals and nursing note reviewed.  Constitutional:      General: She is not in acute distress.    Appearance: She is well-developed.  HENT:     Head: Normocephalic and atraumatic.  Cardiovascular:     Rate and Rhythm: Normal rate and regular rhythm.     Pulses: Normal pulses.  Pulmonary:     Effort: Pulmonary effort is normal. No respiratory distress.     Breath sounds: Normal breath sounds.  Abdominal:     General: Abdomen is protuberant. Bowel sounds are normal.     Palpations: Abdomen is soft.     Tenderness: There is abdominal tenderness (suprapubic fullness c/w enlarged uterus). There is no right CVA tenderness, left CVA tenderness, guarding or rebound.  Skin:    General: Skin is warm and dry.     Capillary Refill: Capillary refill takes less than 2 seconds.     Findings: No rash.          Comments: 3 cm soft smooth mobile non tender mass  Neurological:     Mental Status: She is alert and oriented to person, place, and time.  Psychiatric:        Mood and Affect: Mood normal.        Behavior: Behavior normal.     Wt Readings from Last 3 Encounters:  06/23/22 165 lb (74.8 kg)  07/16/20 182 lb (82.6 kg)  03/04/15 197 lb (89.4 kg)    BP 126/78   Pulse 97   Ht '5\' 4"'$  (1.626 m)   Wt 165 lb (74.8 kg)   LMP  06/17/2022 (Exact Date)   SpO2 98%   BMI 28.32 kg/m   Assessment and Plan: Problem List Items Addressed This Visit       Genitourinary   Intramural and submucous leiomyoma of uterus - Primary    Last [place] 2 yrs ago Still having heavy bleeding and now pelvic fullness Will repeat US and likely refer to GYN surgery      Relevant Orders   US Pelvic Complete With Transvaginal     Other   Lipoma of torso  May be enlarging - pt prefers to have it excised Refer to General surgery      Relevant Orders   Ambulatory referral to General Surgery   Menorrhagia with regular cycle    Will obtain CBC to rule out anemia      Relevant Orders   CBC with Differential/Platelet     Partially dictated using Dragon software. Any errors are unintentional.  Halina Maidens, MD [facility]  06/23/2022

## 2022-06-23 NOTE — Assessment & Plan Note (Signed)
Will obtain CBC to rule out anemia

## 2022-06-23 NOTE — Assessment & Plan Note (Signed)
May be enlarging - pt prefers to have it excised Refer to General surgery

## 2022-06-23 NOTE — Assessment & Plan Note (Signed)
Last [place] 2 yrs ago Still having heavy bleeding and now pelvic fullness Will repeat US and likely refer to GYN surgery

## 2022-06-24 LAB — CBC WITH DIFFERENTIAL/PLATELET
Basophils Absolute: 0.1 10*3/uL (ref 0.0–0.2)
Basos: 1 %
EOS (ABSOLUTE): 0.1 10*3/uL (ref 0.0–0.4)
Eos: 1 %
Hematocrit: 29.7 % — ABNORMAL LOW (ref 34.0–46.6)
Hemoglobin: 9 g/dL — ABNORMAL LOW (ref 11.1–15.9)
Immature Grans (Abs): 0 10*3/uL (ref 0.0–0.1)
Immature Granulocytes: 0 %
Lymphocytes Absolute: 3.6 10*3/uL — ABNORMAL HIGH (ref 0.7–3.1)
Lymphs: 39 %
MCH: 23.3 pg — ABNORMAL LOW (ref 26.6–33.0)
MCHC: 30.3 g/dL — ABNORMAL LOW (ref 31.5–35.7)
MCV: 77 fL — ABNORMAL LOW (ref 79–97)
Monocytes Absolute: 0.7 10*3/uL (ref 0.1–0.9)
Monocytes: 7 %
Neutrophils Absolute: 4.8 10*3/uL (ref 1.4–7.0)
Neutrophils: 52 %
Platelets: 363 10*3/uL (ref 150–450)
RBC: 3.86 x10E6/uL (ref 3.77–5.28)
RDW: 18.7 % — ABNORMAL HIGH (ref 11.7–15.4)
WBC: 9.2 10*3/uL (ref 3.4–10.8)

## 2022-06-27 ENCOUNTER — Telehealth: Payer: Self-pay | Admitting: General Practice

## 2022-06-27 ENCOUNTER — Ambulatory Visit: Admission: RE | Admit: 2022-06-27 | Payer: 59 | Source: Ambulatory Visit

## 2022-06-27 NOTE — Telephone Encounter (Signed)
Copied from [place] [phone]. Topic: General - Inquiry >> Jun 27, 2022 11:51 AM Marcellus Scott wrote: Reason for CRM: Pt stated she is being asked for $972 for her upcoming ultrasound today. Pt stated she needs to reschedule because she does not have the money to pay that offered to give her direct number to [facility]. Pt declined and asked to have a nurse help her reschedule due to her being at work, requesting a call back from a member of clinical staff.  Please advise.

## 2022-07-02 ENCOUNTER — Ambulatory Visit: Payer: 59 | Admitting: Surgery

## 2022-07-07 ENCOUNTER — Ambulatory Visit: Payer: Self-pay | Admitting: Surgery

## 2022-07-21 ENCOUNTER — Ambulatory Visit: Payer: Self-pay | Admitting: Surgery

## 2022-12-22 ENCOUNTER — Encounter: Payer: 59 | Admitting: Internal Medicine

## 2022-12-22 DIAGNOSIS — Z131 Encounter for screening for diabetes mellitus: Secondary | ICD-10-CM | POA: Insufficient documentation

## 2022-12-22 NOTE — Progress Notes (Deleted)
Date:  12/22/2022   Name:  Chelsey Edwards   DOB:  1975-08-30   MRN:  [id number]   Chief Complaint: No chief complaint on file. Chelsey Edwards is a 47 y.o. female who presents today for her Complete Annual Exam. She feels {DESC; WELL/FAIRLY WELL/POORLY:18703}. She reports exercising ***. She reports she is sleeping {DESC; WELL/FAIRLY WELL/POORLY:18703}. Breast complaints ***.  Mammogram: none DEXA: none Pap smear: 09/2020 neg thin prep - GYN Colonoscopy: none  Health Maintenance Due  Topic Date Due   Pneumococcal Vaccine 59-69 Years old (1 of 2 - PCV) Never done   HIV Screening  Never done   Hepatitis C Screening  Never done   DTaP/Tdap/Td (1 - Tdap) Never done   Colonoscopy  Never done   COVID-19 Vaccine (1 - 2023-24 season) Never done     There is no immunization history on file for this patient.  HPI  Lab Results  Component Value Date   NA 137 07/16/2020   K 4.1 07/16/2020   CO2 23 07/16/2020   GLUCOSE 87 07/16/2020   BUN 9 07/16/2020   CREATININE 0.58 07/16/2020   CALCIUM 9.0 07/16/2020   GFRNONAA >60 07/16/2020   No results found for: "CHOL", "HDL", "LDLCALC", "LDLDIRECT", "TRIG", "CHOLHDL" No results found for: "TSH" No results found for: "HGBA1C" Lab Results  Component Value Date   WBC 9.2 06/23/2022   HGB 9.0 (L) 06/23/2022   HCT 29.7 (L) 06/23/2022   MCV 77 (L) 06/23/2022   PLT 363 06/23/2022   Lab Results  Component Value Date   ALT 13 07/16/2020   AST 14 (L) 07/16/2020   ALKPHOS 49 07/16/2020   BILITOT 0.3 07/16/2020   No results found for: "25OHVITD2", "25OHVITD3", "VD25OH"   Review of Systems  Constitutional:  Negative for chills, fatigue and fever.  HENT:  Negative for congestion, hearing loss, tinnitus, trouble swallowing and voice change.   Eyes:  Negative for visual disturbance.  Respiratory:  Negative for cough, chest tightness, shortness of breath and wheezing.   Cardiovascular:  Negative for chest pain, palpitations and leg  swelling.  Gastrointestinal:  Negative for abdominal pain, constipation, diarrhea and vomiting.  Endocrine: Negative for polydipsia and polyuria.  Genitourinary:  Negative for dysuria, frequency, genital sores, vaginal bleeding and vaginal discharge.  Musculoskeletal:  Negative for arthralgias, gait problem and joint swelling.  Skin:  Negative for color change and rash.  Neurological:  Negative for dizziness, tremors, light-headedness and headaches.  Hematological:  Negative for adenopathy. Does not bruise/bleed easily.  Psychiatric/Behavioral:  Negative for dysphoric mood and sleep disturbance. The patient is not nervous/anxious.     Patient Active Problem List   Diagnosis Date Noted   Intramural and submucous leiomyoma of uterus 06/23/2022   Menorrhagia with regular cycle 06/23/2022   Lipoma of torso 06/23/2022    No Known Allergies  No past surgical history on file.  Social History   Tobacco Use   Smoking status: Every Day    Current packs/day: 0.50    Average packs/day: 0.5 packs/day for 20.5 years (10.3 ttl pk-yrs)    Types: Cigarettes    Start date: 2004   Smokeless tobacco: Never  Vaping Use   Vaping status: Never Used  Substance Use Topics   Alcohol use: Yes    Comment: occasionally   Drug use: No     Medication list has been reviewed and updated.  No outpatient medications have been marked as taking for the 12/22/22 encounter (Appointment) with Judithann Graves,  Nyoka Cowden, MD.       06/23/2022    2:53 PM  GAD 7 : Generalized Anxiety Score  Nervous, Anxious, on Edge 0  Control/stop worrying 0  Worry too much - different things 0  Trouble relaxing 0  Restless 0  Easily annoyed or irritable 1  Afraid - awful might happen 0  Total GAD 7 Score 1  Anxiety Difficulty Not difficult at all       06/23/2022    2:52 PM  Depression screen PHQ 2/9  Decreased Interest 1  Down, Depressed, Hopeless 1  PHQ - 2 Score 2  Altered sleeping 1  Tired, decreased energy 2   Change in appetite 0  Feeling bad or failure about yourself  0  Trouble concentrating 0  Moving slowly or fidgety/restless 0  Suicidal thoughts 0  PHQ-9 Score 5  Difficult doing work/chores Not difficult at all    BP Readings from Last 3 Encounters:  06/23/22 126/78  07/16/20 123/79  12/13/19 (!) 150/90    Physical Exam Vitals and nursing note reviewed.  Constitutional:      General: She is not in acute distress.    Appearance: She is well-developed.  HENT:     Head: Normocephalic and atraumatic.     Right Ear: Tympanic membrane and ear canal normal.     Left Ear: Tympanic membrane and ear canal normal.     Nose:     Right Sinus: No maxillary sinus tenderness.     Left Sinus: No maxillary sinus tenderness.  Eyes:     General: No scleral icterus.       Right eye: No discharge.        Left eye: No discharge.     Conjunctiva/sclera: Conjunctivae normal.  Neck:     Thyroid: No thyromegaly.     Vascular: No carotid bruit.  Cardiovascular:     Rate and Rhythm: Normal rate and regular rhythm.     Pulses: Normal pulses.     Heart sounds: Normal heart sounds.  Pulmonary:     Effort: Pulmonary effort is normal. No respiratory distress.     Breath sounds: No wheezing.  Chest:  Breasts:    Right: No mass, nipple discharge, skin change or tenderness.     Left: No mass, nipple discharge, skin change or tenderness.  Abdominal:     General: Bowel sounds are normal.     Palpations: Abdomen is soft.     Tenderness: There is no abdominal tenderness.  Musculoskeletal:     Cervical back: Normal range of motion. No erythema.     Right lower leg: No edema.     Left lower leg: No edema.  Lymphadenopathy:     Cervical: No cervical adenopathy.  Skin:    General: Skin is warm and dry.     Findings: No rash.  Neurological:     Mental Status: She is alert and oriented to person, place, and time.     Cranial Nerves: No cranial nerve deficit.     Sensory: No sensory deficit.      Deep Tendon Reflexes: Reflexes are normal and symmetric.  Psychiatric:        Attention and Perception: Attention normal.        Mood and Affect: Mood normal.     Wt Readings from Last 3 Encounters:  06/23/22 165 lb (74.8 kg)  07/16/20 182 lb (82.6 kg)  03/04/15 197 lb (89.4 kg)    There were no vitals taken  for this visit.  Assessment and Plan:  Problem List Items Addressed This Visit   None Visit Diagnoses     Annual physical exam    -  Primary   Encounter for screening mammogram for breast cancer       Colon cancer screening       Need for hepatitis C screening test       Anemia, unspecified type       Screening for lipid disorders       Screening for diabetes mellitus           No follow-ups on file.    Reubin Milan, MD [facility] [place]

## 2022-12-23 ENCOUNTER — Encounter: Payer: Self-pay | Admitting: Internal Medicine

## 2023-01-15 IMAGING — US US PELVIS COMPLETE TRANSABD/TRANSVAG W DUPLEX
1 series · 13 of 25 positions shown · non-contrast
Comparison: CT abdomen pelvis dated 07/16/2020.

CLINICAL DATA: 44-year-old female with right lower quadrant
abdominal pain.

EXAM:
TRANSABDOMINAL AND TRANSVAGINAL ULTRASOUND OF PELVIS
DOPPLER ULTRASOUND OF OVARIES
TECHNIQUE: Both transabdominal and transvaginal ultrasound examinations of the
pelvis were performed. Transabdominal technique was performed for
global imaging of the pelvis including uterus, ovaries, adnexal
regions, and pelvic cul-de-sac.
It was necessary to proceed with endovaginal exam following the
transabdominal exam to visualize the endometrium and ovaries. Color
and duplex Doppler ultrasound was utilized to evaluate blood flow to
the ovaries.

[Series 1: us pelvic complete w transvaginal and torsion righ · 13 of 119 slices shown]
[im 1/119]
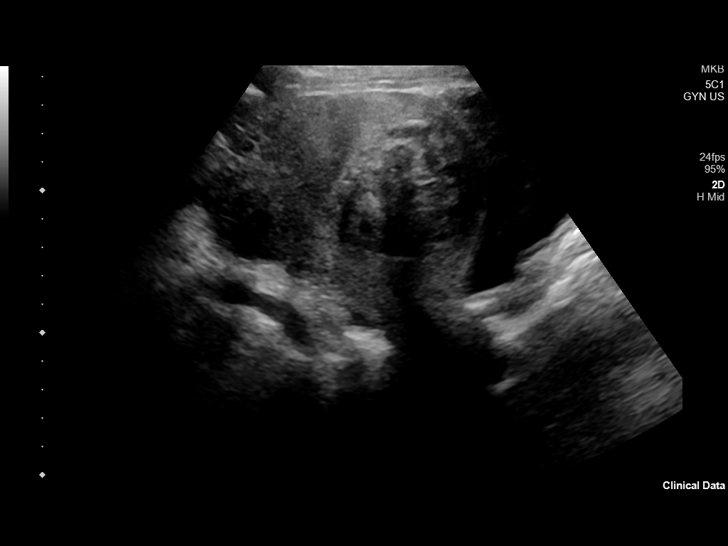
[im 10/119]
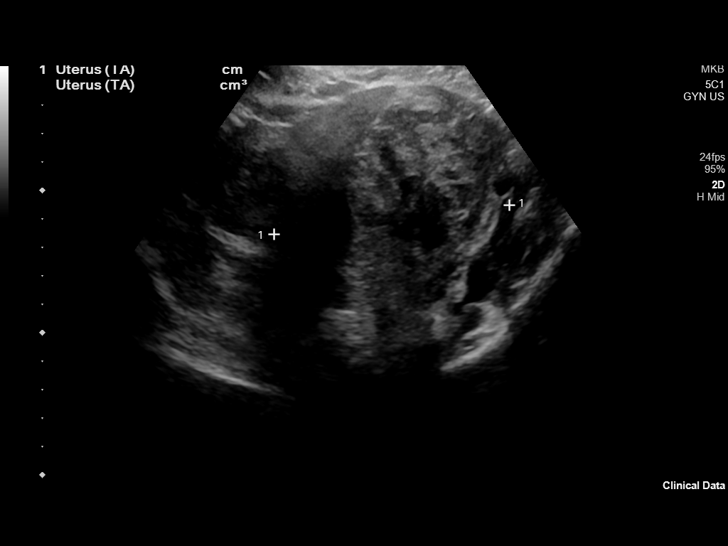
[im 20/119]
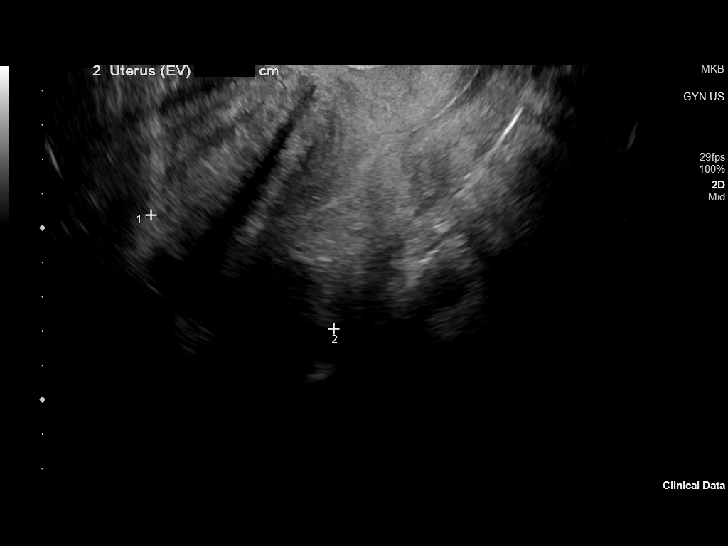
[im 30/119]
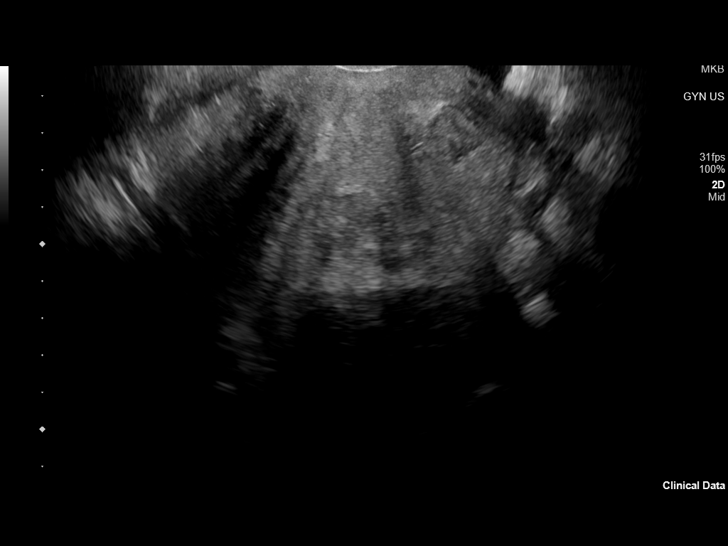
[im 40/119]
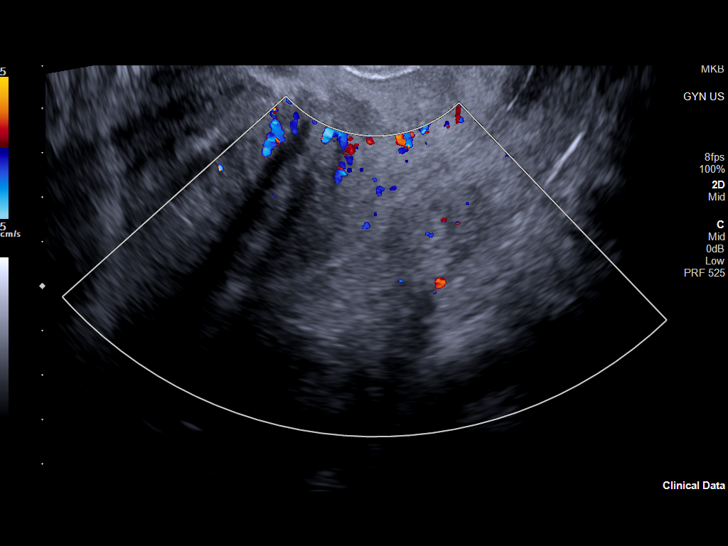
[im 50/119]
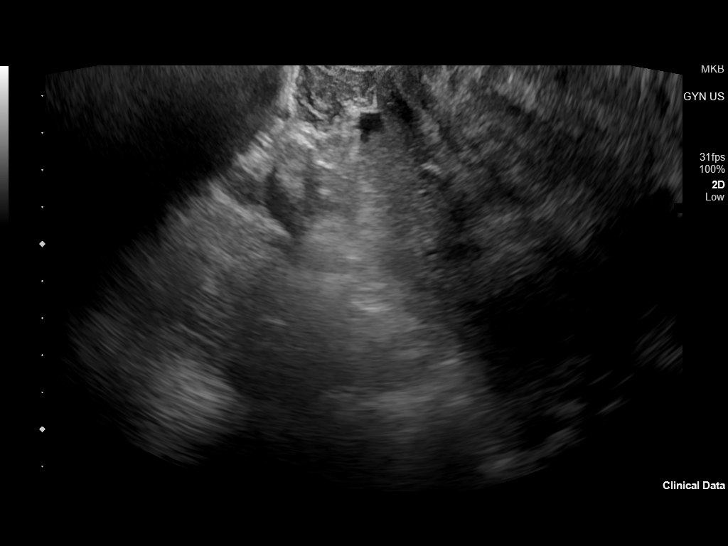
[im 60/119]
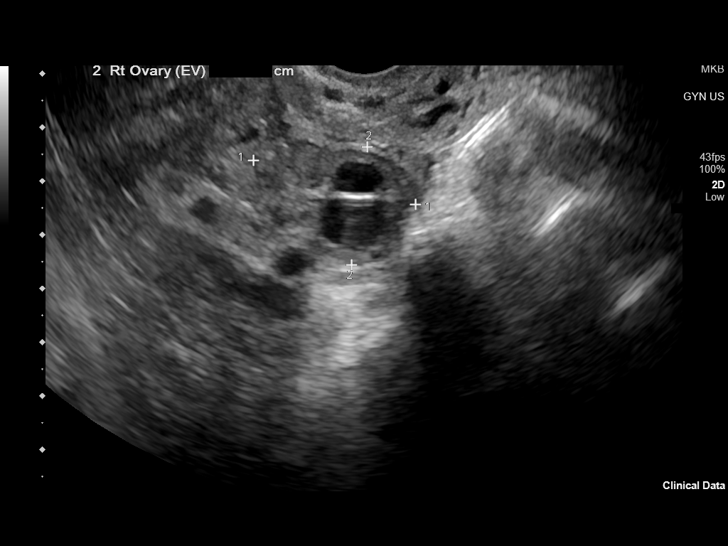
[im 69/119]
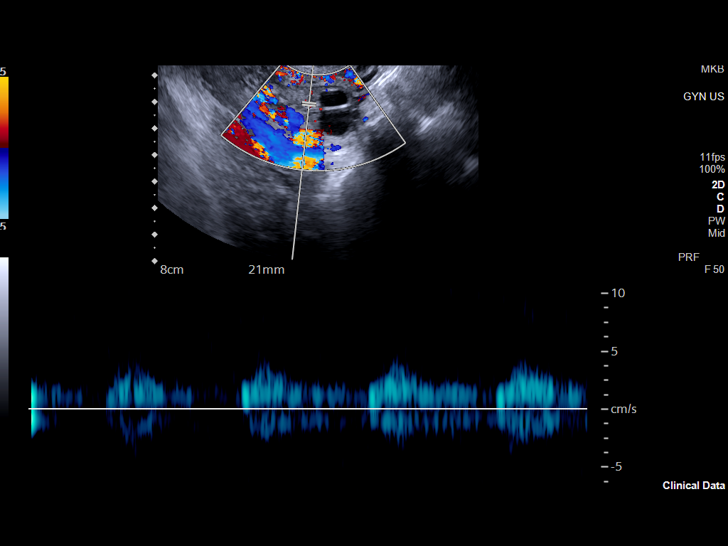
[im 79/119]
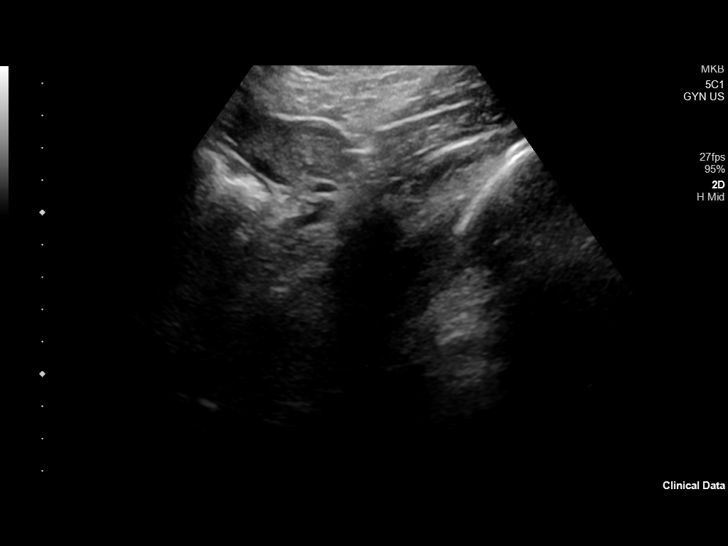
[im 89/119]
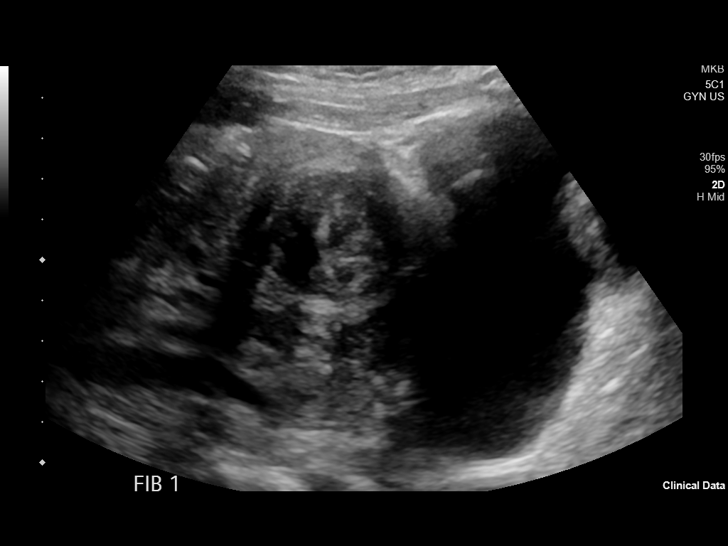
[im 99/119]
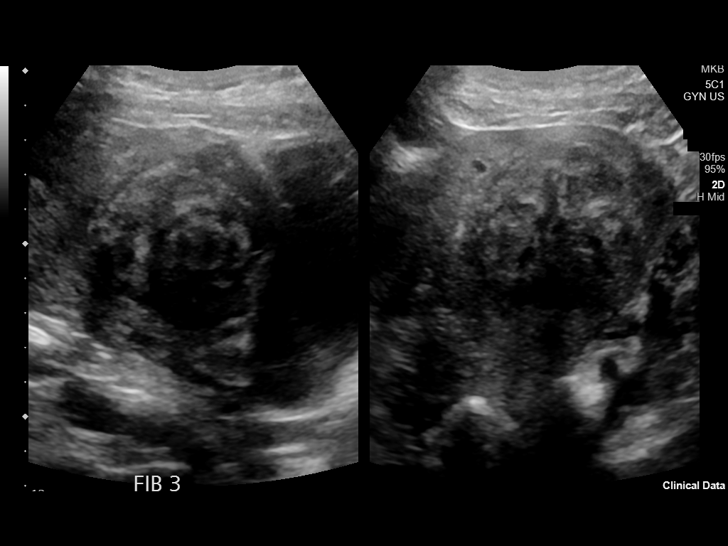
[im 109/119]
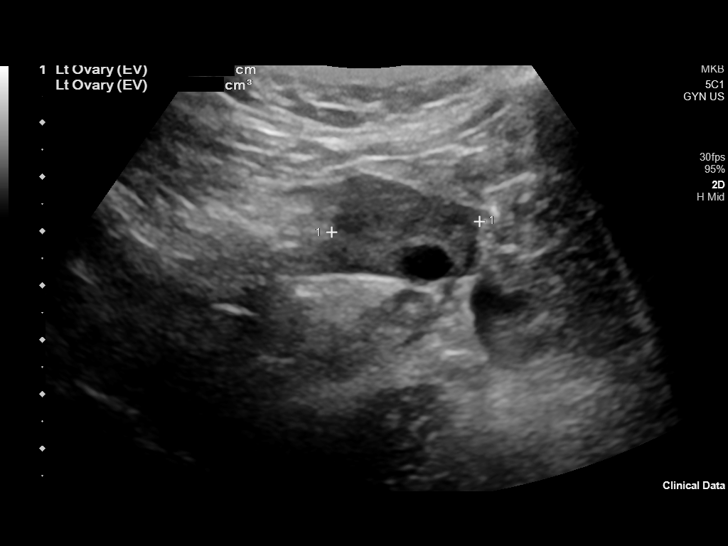
[im 119/119]
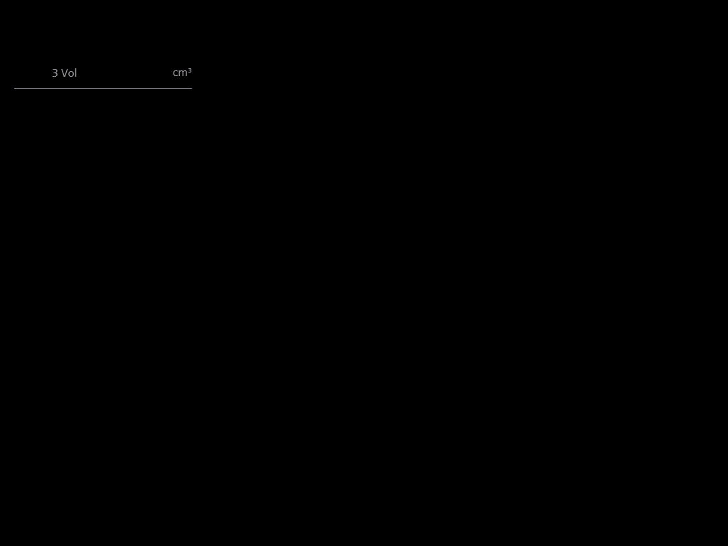

[13 of 25 positions shown; findings below may reference images not displayed]

FINDINGS: Uterus

Measurements: 11.2 x 8.2 x 8.3 cm = volume: 402 mL. The uterus is
anteverted. The uterus is enlarged and myomatous and demonstrates a
heterogeneous echotexture. Multiple intramural fibroids including a
3.6 x 4.0 x 4.2 cm right upper body, a 4.0 x 3.7 x 4.5 cm posterior
midbody, and a 5.0 x 4.9 x 5.1 cm left upper body fibroid. Six

Endometrium

Thickness: 8 mm. Evaluation of the endometrium is limited due to
suboptimal visualization. The visualized endometrium appears
unremarkable.

Right ovary

Measurements: 3.1 x 2.2 x 2.2 cm = volume: 8.2 mL. Normal
appearance/no adnexal mass.

Left ovary

Measurements: 4.0 x 2.1 x 2.7 cm = volume: 11.8 mL. Normal
appearance/no adnexal mass.

Pulsed Doppler evaluation of both ovaries demonstrates normal
low-resistance arterial and venous waveforms.

Other findings

No abnormal free fluid.
IMPRESSION: 1. Enlarged myomatous uterus.
2. Unremarkable visualized endometrium and ovaries.
